# Patient Record
Sex: Female | Born: 1990 | Race: White | Hispanic: No | Marital: Single
Health system: Southern US, Community
[De-identification: ages and names within clinical notes are randomized; demographics above are authoritative.]

---
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
## 2011-07-07 ENCOUNTER — Inpatient Hospital Stay: Payer: Self-pay | Admitting: Pediatrics

## 2011-07-07 LAB — CBC WITH DIFFERENTIAL/PLATELET
Basophil #: 0 10*3/uL (ref 0.0–0.1)
Basophil %: 0.3 %
Eosinophil #: 0 10*3/uL (ref 0.0–0.7)
HCT: 41.7 % (ref 35.0–47.0)
HGB: 14.2 g/dL (ref 12.0–16.0)
Lymphocyte #: 1.7 10*3/uL (ref 1.0–3.6)
MCH: 30.6 pg (ref 26.0–34.0)
MCHC: 34 g/dL (ref 32.0–36.0)
Monocyte #: 0.5 10*3/uL (ref 0.0–0.7)
Monocyte %: 12.2 %
Neutrophil #: 1.8 10*3/uL (ref 1.4–6.5)
Neutrophil %: 44.4 %
Platelet: 54 10*3/uL — ABNORMAL LOW (ref 150–440)
RDW: 12.9 % (ref 11.5–14.5)

## 2011-07-07 LAB — BASIC METABOLIC PANEL
BUN: 10 mg/dL (ref 7–18)
Calcium, Total: 8.5 mg/dL — ABNORMAL LOW (ref 9.0–10.7)
Co2: 24 mmol/L (ref 21–32)
Creatinine: 0.83 mg/dL (ref 0.60–1.30)
EGFR (African American): >60
EGFR (Non-African Amer.): >60
Glucose: 106 mg/dL — ABNORMAL HIGH (ref 65–99)
Osmolality: 277 (ref 275–301)

## 2011-07-07 LAB — RAPID INFLUENZA A&B ANTIGENS

## 2011-07-08 LAB — URINALYSIS, COMPLETE
Bacteria: NONE SEEN
Blood: NEGATIVE
Ketone: NEGATIVE
Nitrite: NEGATIVE
Protein: NEGATIVE
Specific Gravity: 1.015 (ref 1.003–1.030)
Squamous Epithelial: NONE SEEN
WBC UR: <1 /HPF (ref 0–5)

## 2011-07-08 LAB — COMPREHENSIVE METABOLIC PANEL
Albumin: 3.4 g/dL — ABNORMAL LOW (ref 3.8–5.6)
Anion Gap: 9 (ref 7–16)
Bilirubin,Total: 3.7 mg/dL — ABNORMAL HIGH (ref 0.2–1.0)
Calcium, Total: 8.5 mg/dL — ABNORMAL LOW (ref 9.0–10.7)
Co2: 25 mmol/L (ref 21–32)
EGFR (African American): >60
Glucose: 103 mg/dL — ABNORMAL HIGH (ref 65–99)
Potassium: 3.7 mmol/L (ref 3.5–5.1)
SGPT (ALT): 256 U/L — ABNORMAL HIGH
Sodium: 139 mmol/L (ref 136–145)
Total Protein: 6.6 g/dL (ref 6.4–8.6)

## 2011-07-08 LAB — CBC WITH DIFFERENTIAL/PLATELET
Eosinophil #: 0 10*3/uL (ref 0.0–0.7)
Eosinophil: 1 %
Lymphocyte %: 54.2 %
Lymphocytes: 50 %
MCHC: 33.7 g/dL (ref 32.0–36.0)
Monocyte #: 0.3 10*3/uL (ref 0.0–0.7)
Monocyte %: 9.1 %
Monocytes: 7 %
Neutrophil #: 1.2 10*3/uL — ABNORMAL LOW (ref 1.4–6.5)
Neutrophil %: 35.5 %
Platelet: 54 10*3/uL — ABNORMAL LOW (ref 150–440)
RDW: 13.3 % (ref 11.5–14.5)
Segmented Neutrophils: 31 %
WBC: 3.3 10*3/uL — ABNORMAL LOW (ref 3.6–11.0)

## 2011-07-08 LAB — MONONUCLEOSIS SCREEN: Mono Test: POSITIVE

## 2011-07-09 LAB — CBC WITH DIFFERENTIAL/PLATELET
Basophil %: 0.5 %
Eosinophil #: 0 10*3/uL (ref 0.0–0.7)
Eosinophil %: 0.6 %
Eosinophil: 2 %
HGB: 12.8 g/dL (ref 12.0–16.0)
Lymphocyte #: 1.8 10*3/uL (ref 1.0–3.6)
Lymphocyte %: 53.1 %
Lymphocytes: 34 %
MCHC: 33.8 g/dL (ref 32.0–36.0)
Monocyte %: 11.5 %
Neutrophil %: 34.3 %
RBC: 4.21 10*6/uL (ref 3.80–5.20)
WBC: 3.4 10*3/uL — ABNORMAL LOW (ref 3.6–11.0)

## 2011-07-09 LAB — AMYLASE: Amylase: 37 U/L (ref 25–115)

## 2011-07-09 LAB — HEPATIC FUNCTION PANEL A (ARMC)
Bilirubin, Direct: 3 mg/dL — ABNORMAL HIGH (ref 0.00–0.20)
SGOT(AST): 204 U/L — ABNORMAL HIGH (ref 0–26)
SGPT (ALT): 225 U/L — ABNORMAL HIGH

## 2011-07-10 LAB — CBC WITH DIFFERENTIAL/PLATELET
Basophil #: 0 10*3/uL (ref 0.0–0.1)
Basophil %: 0.3 %
Comment - H1-Com1: NORMAL
Eosinophil #: 0 10*3/uL (ref 0.0–0.7)
Eosinophil %: 0.5 %
Eosinophil: 1 %
HCT: 38.8 % (ref 35.0–47.0)
Lymphocyte #: 2.8 10*3/uL (ref 1.0–3.6)
Lymphocyte %: 60 %
Lymphocytes: 53 %
Monocyte %: 11.4 %
Monocytes: 9 %
Neutrophil #: 1.3 10*3/uL — ABNORMAL LOW (ref 1.4–6.5)
Platelet: 55 10*3/uL — ABNORMAL LOW (ref 150–440)
RDW: 13.9 % (ref 11.5–14.5)
Segmented Neutrophils: 30 %
Variant Lymphocyte - H1-Rlymph: 7 %
WBC: 4.7 10*3/uL (ref 3.6–11.0)

## 2011-07-10 LAB — HEPATIC FUNCTION PANEL A (ARMC)
Albumin: 2.8 g/dL — ABNORMAL LOW (ref 3.8–5.6)
Alkaline Phosphatase: 209 U/L — ABNORMAL HIGH (ref 82–169)
Bilirubin, Direct: 4 mg/dL — ABNORMAL HIGH (ref 0.00–0.20)
SGPT (ALT): 258 U/L — ABNORMAL HIGH
Total Protein: 5.9 g/dL — ABNORMAL LOW (ref 6.4–8.6)

## 2011-07-10 LAB — BETA STREP CULTURE(ARMC)

## 2011-07-15 IMAGING — CR LEFT WRIST - COMPLETE 3+ VIEW
1 series · 4 of 4 positions shown · non-contrast
Comparison: none

REASON FOR EXAM: pain swelling injury 05-09-2010
COMMENTS:

PROCEDURE:     KDR - KDXR WRIST LT COMP WITH OBLIQUES  - May 11, 2010  [DATE]
RESULT:     No fracture, dislocation or other acute bony abnormality is
identified.

[Series 2: view not recorded · 0.17mm/px · 4 of 4 slices shown]
[im 1/4]
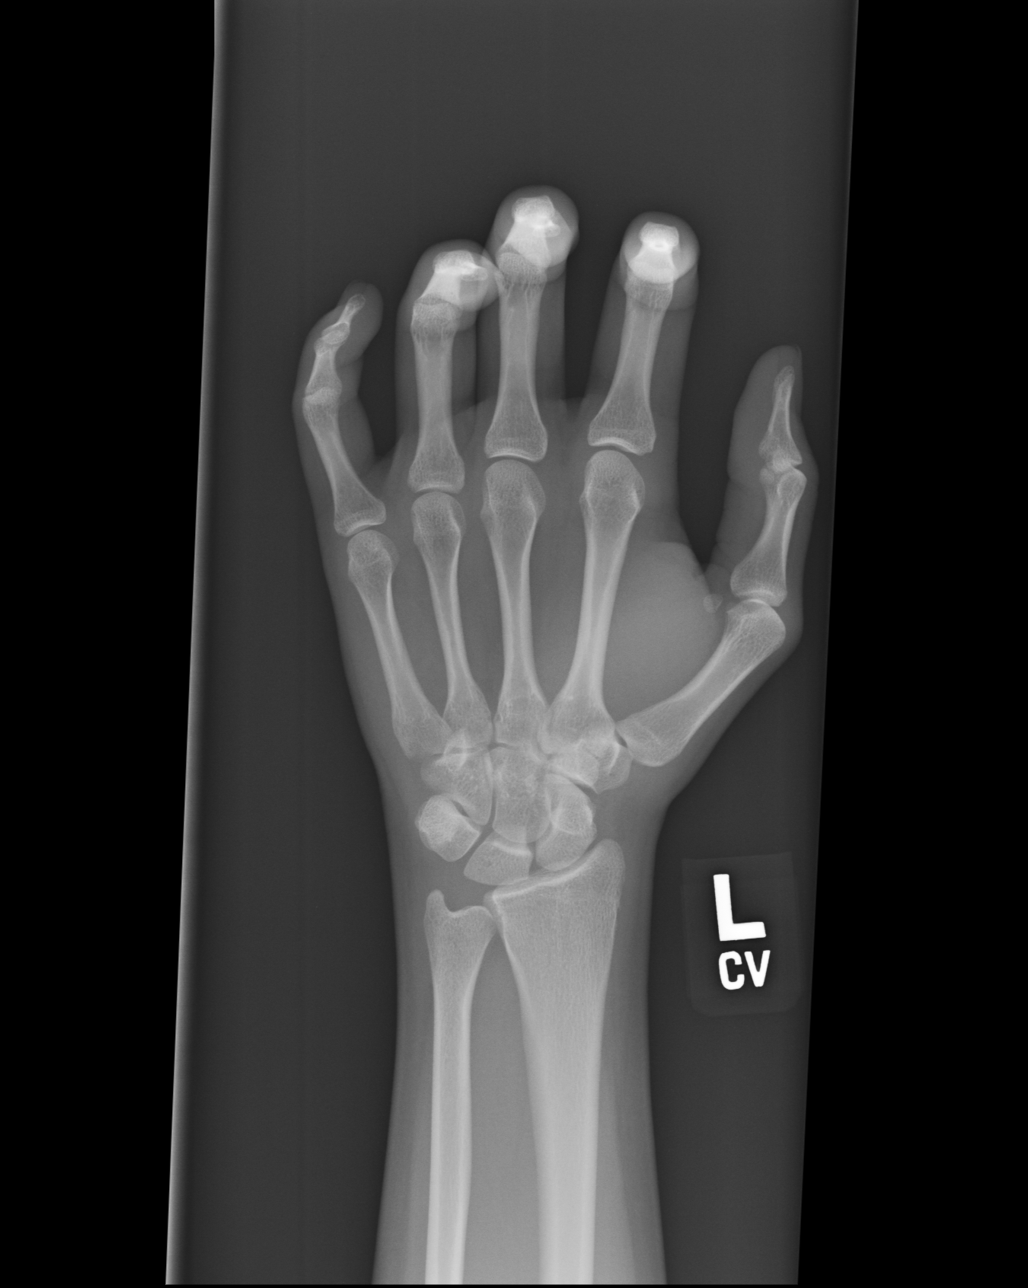
[im 2/4]
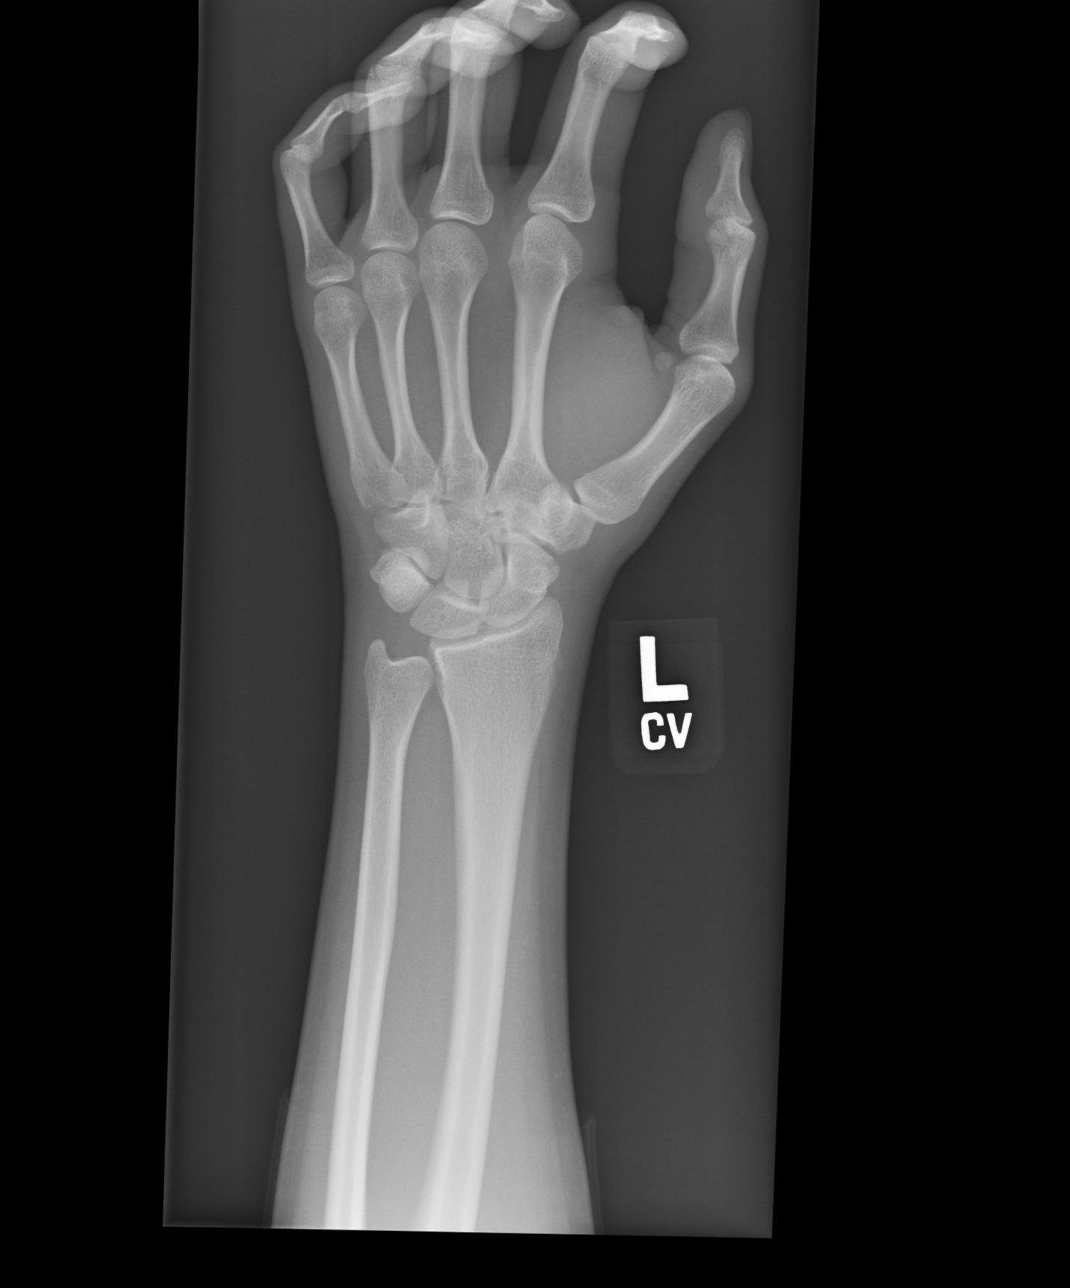
[im 3/4]
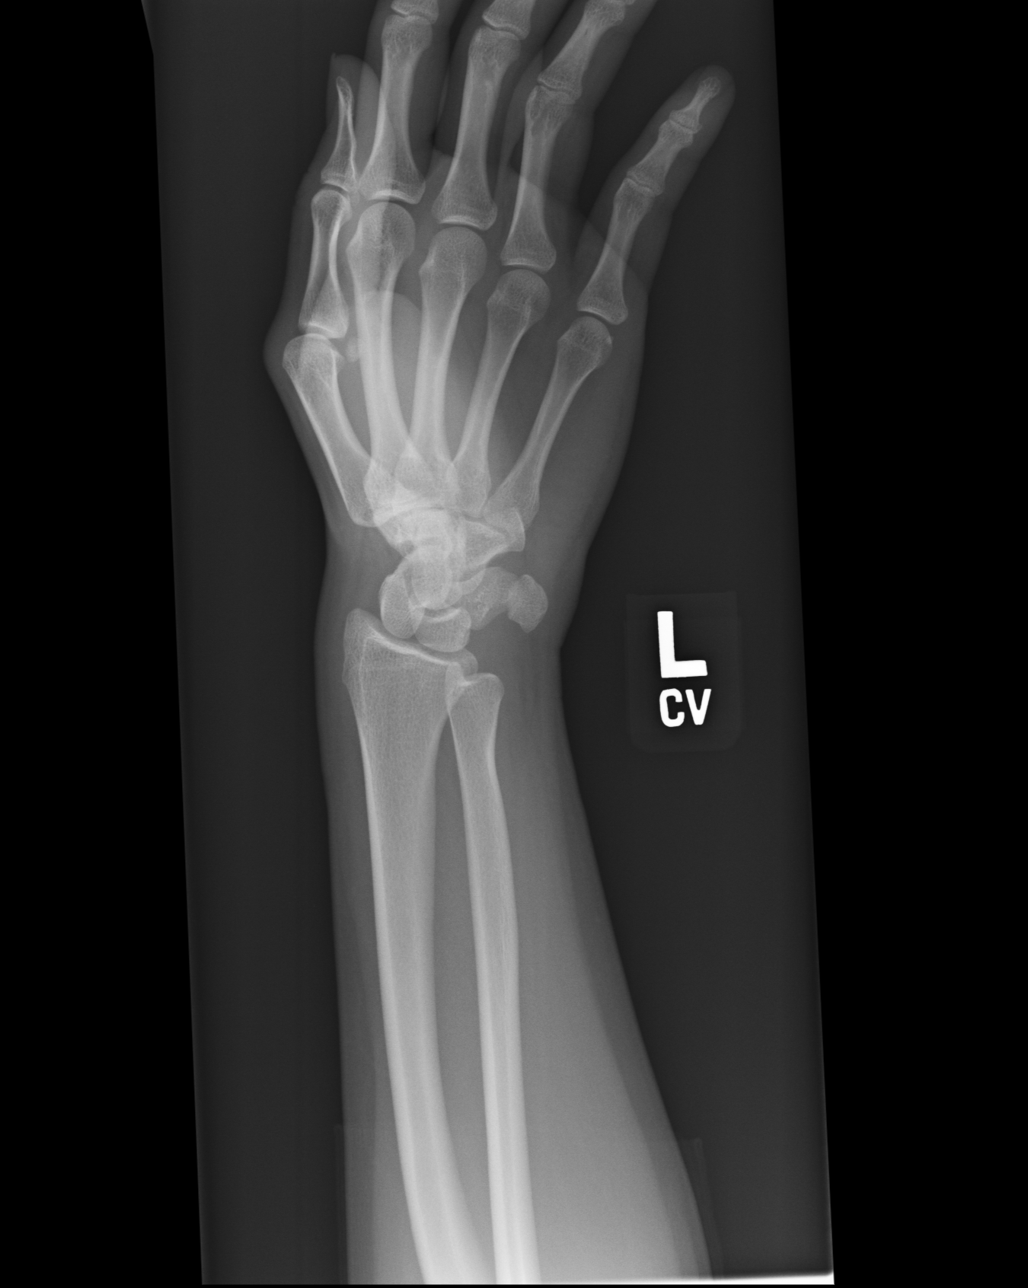
[im 4/4]
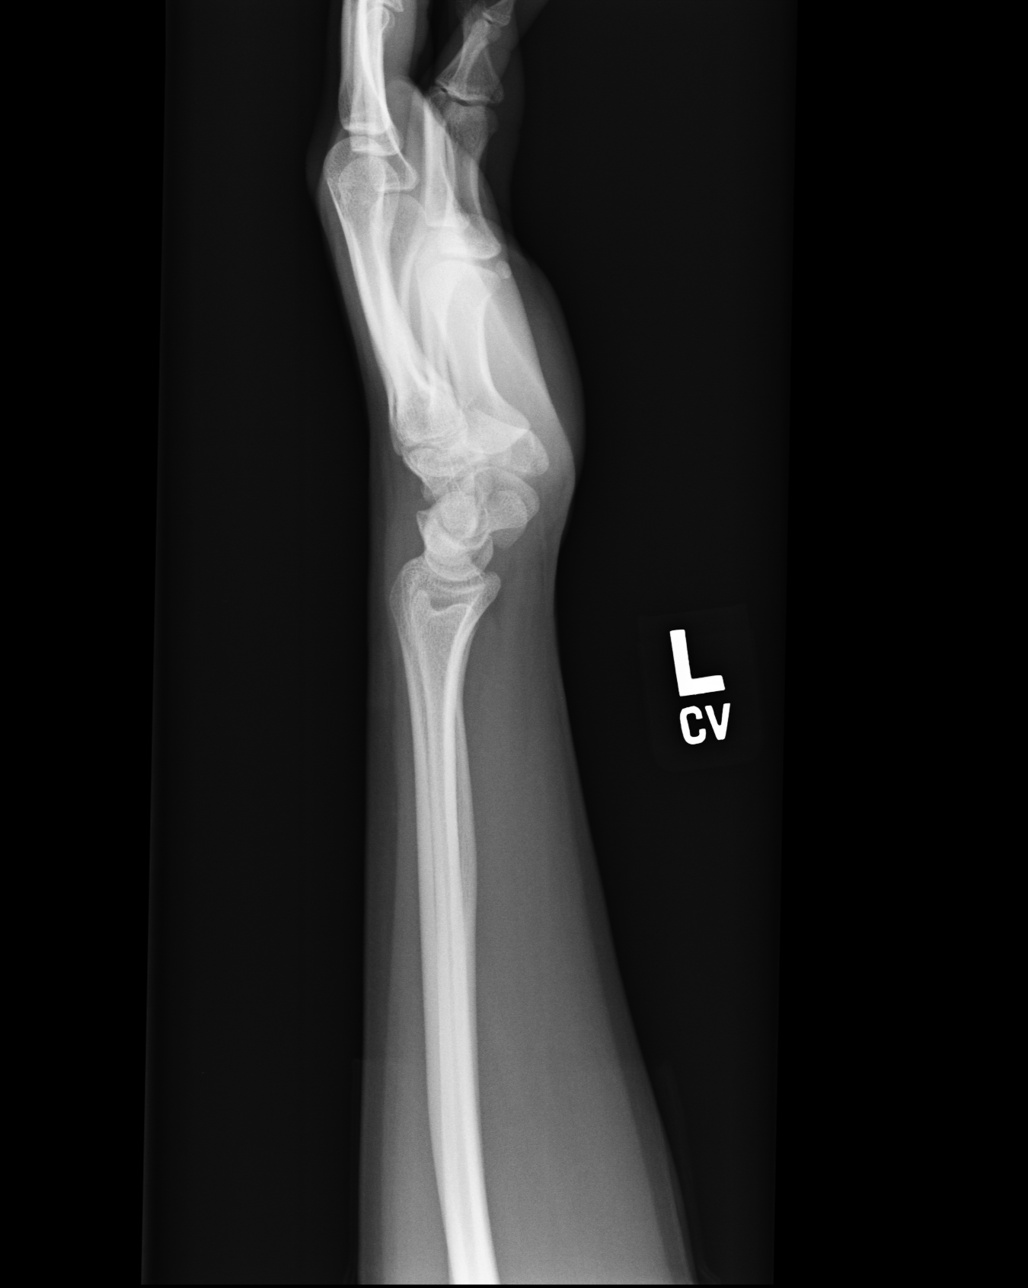

[4 of 4 positions shown; findings below may reference images not displayed]

IMPRESSION: No significant osseous abnormalities are noted.

## 2011-07-16 ENCOUNTER — Other Ambulatory Visit: Payer: Self-pay | Admitting: Pediatrics

## 2011-07-16 LAB — HEPATIC FUNCTION PANEL A (ARMC)
Alkaline Phosphatase: 376 U/L — ABNORMAL HIGH (ref 82–169)
SGPT (ALT): 180 U/L — ABNORMAL HIGH

## 2011-07-16 LAB — CBC WITH DIFFERENTIAL/PLATELET
Comment - H1-Com1: NORMAL
HCT: 41.5 % (ref 35.0–47.0)
MCHC: 33.8 g/dL (ref 32.0–36.0)
MCV: 89 fL (ref 80–100)
Monocytes: 12 %
Platelet: 177 10*3/uL (ref 150–440)
RBC: 4.66 10*6/uL (ref 3.80–5.20)
Segmented Neutrophils: 19 %
WBC: 10.1 10*3/uL (ref 3.6–11.0)

## 2014-09-29 NOTE — Discharge Summary (Signed)
PATIENT NAME:  [name], [name] MR#:  [id number] DATE OF BIRTH:  February 19, 1991  DATE OF ADMISSION:  07/07/2011 DATE OF DISCHARGE:  07/10/2011  DISCHARGE DIAGNOSES:  1. Viral syndrome, mononucleosis.  2. Hepatitis.  3. Thrombocytopenia.  4. Group C strep pharyngitis.  5. Vomiting and dehydration.   HISTORY OF PRESENT ILLNESS: Please see previously dictated history and physical for details of presentation.   HOSPITAL COURSE: As noted above, this 24 year old white female was admitted with above diagnoses. Inpatient management included admission to the pediatric floor. She was placed initially on continuous pulse oximetry and cardiorespiratory monitoring. IV fluids were placed and she was bolused with normal saline followed by maintenance and half fluids over the first evening of admission and was maintained for the next two days on maintenance and a quarter fluids. Evaluation on admission included nasopharyngeal swab for influenza which was negative, electrolytes were drawn and sent. She also had a CBC which revealed WBC 4700 initially with 27 neutrophils, 60 lymphs, and 11.4% monocytes. Platelet count notably was 54,000 on admission. (dictation cut off here)  ____________________________ Gwendalyn EgeKristen S. Suzie PortelaMoffitt, MD ksm:cms D: 07/10/2011 16:09:40 ET T: 07/12/2011 13:11:02 ET JOB#: [id number]  cc: Gwendalyn EgeKristen S. Suzie PortelaMoffitt, MD, <Dictator> Gwendalyn EgeKRISTEN S MOFFITT MD ELECTRONICALLY SIGNED 07/12/2011 19:44

## 2014-09-29 NOTE — Discharge Summary (Signed)
PATIENT NAME:  Sonya Graham, Sonya Graham MR#:  [id number] DATE OF BIRTH:  1991-06-02  DATE OF ADMISSION:  07/07/2011 DATE OF DISCHARGE:  07/10/2011  DISCHARGE DIAGNOSES:  1. Viral syndrome- mononucleosis with hepatitis and thrombocytopenia.  2. Group C streptococcal pharyngitis.   3. Vomiting and dehydration.   Please see previously dictated History and Physical for details of presentation.   HOSPITAL COURSE: As noted above, this 24 year old white female was admitted with the above diagnoses. Inpatient management included admission to the pediatric floor. She was placed on continuous pulse oximetry. IV fluids were begun and she was initially bolused with normal saline followed by rehydration over the first night of hospitalization and then later maintained on D5 half-normal saline with KCl at just over maintenance fluids for adequate resolution of her dehydration. Inpatient evaluation included a nasopharyngeal swab for influenza A and B, which were negative. Throat culture was sent, ultimately growing group C beta strep. Blood culture was also sent which was negative at the time of discharge. Initial white count revealed a WBC of 4000 with normal hemoglobin and hematocrit. Differential was significant for a viral shift with components of lymphocytes and monocytes. Platelet count was notably 54,000. Electrolytes were also drawn and sent and were essentially within the normal range. Following the initial blood draw, further evaluation was done. Lab work was repeated to document the platelet count, which was again at 54,000 with essentially unchanged CBC, hemoglobin, hematocrit, and differential. Blood [facility] titers IgG and IgM, both of which were negative at the time of discharge. Blood was also sent for Monospot, which was positive, followed by Epstein-Barr virus titers for confirmation which are pending at the time of this dictation. Because of the initial concern about the  possibility of group A strep pharyngitis, the patient was given one dose of Rocephin IV until the final results came back as group B strep. The patient was essentially asymptomatic for pharyngitis. Urinalysis was also sent and was essentially within normal limits with the exception of the elevated bilirubin. Notably among the lab work that was sent on the second draw were liver functions, which were noted to be elevated with the initial total bilirubin being 3.7, SGOT being 249, and SGPT at 256. These labs along with a CBC and platelet count were followed over the subsequent two days, demonstrating essentially unchanged values. However, on the day of discharge the platelet count was beginning to rebound. On the third day of hospitalization, the patient was showing marked improvement in her p.o. intake with increased appetite and much decrease in her nausea and no further vomiting since much earlier the day before. The need for Zofran was markedly diminished and the fevers peaking at 101.5 on the first night of hospitalization were significantly reduced, having only low-grade fevers daily in the evenings. It was felt that the patient could be discharged to home with close followup and observation by her parents. Followup plans were made for two days following discharge for repeat CBC, liver functions to be drawn later in the week. Sonya Graham was to certainly call our office or notify us should concerns arise regarding increase in nausea and vomiting, decreased p.o. intake, increase in abdominal pain, increasing fevers or rash or bleeding. However, it was felt that Sonya Graham had made good progress over the course of her hospitalization and that the manifestations of her viral mononucleosis were beginning to resolve and that she would do well to convalesce further at her home.   ____________________________  Gwendalyn Ege. Suzie Portela, MD ksm:bjt D: 07/10/2011 16:16:22 ET T: 07/12/2011 13:20:29 ET JOB#: [id number]  cc: Gwendalyn Ege. Suzie Portela, MD, <Dictator> Gwendalyn Ege MOFFITT MD ELECTRONICALLY SIGNED 07/12/2011 19:44

## 2019-04-30 ENCOUNTER — Other Ambulatory Visit: Payer: Self-pay | Admitting: *Deleted

## 2019-04-30 DIAGNOSIS — Z20822 Contact with and (suspected) exposure to covid-19: Secondary | ICD-10-CM

## 2019-05-02 LAB — NOVEL CORONAVIRUS, NAA: SARS-CoV-2, NAA: NOT DETECTED
